# Patient Record
Sex: Male | Born: 1950 | Hispanic: Yes | Marital: Married | Smoking: Never smoker
Health system: Southern US, Community
[De-identification: ages and names within clinical notes are randomized; demographics above are authoritative.]

## PROBLEM LIST (undated history)

## (undated) DIAGNOSIS — I10 Essential (primary) hypertension: Secondary | ICD-10-CM

## (undated) DIAGNOSIS — I639 Cerebral infarction, unspecified: Secondary | ICD-10-CM

## (undated) HISTORY — PX: OTHER SURGICAL HISTORY: SHX169

---

## 2009-11-29 ENCOUNTER — Emergency Department (HOSPITAL_BASED_OUTPATIENT_CLINIC_OR_DEPARTMENT_OTHER): Admission: EM | Admit: 2009-11-29 | Discharge: 2009-11-29 | Payer: Self-pay | Admitting: Emergency Medicine

## 2010-10-11 LAB — URINALYSIS, ROUTINE W REFLEX MICROSCOPIC
Glucose, UA: NEGATIVE mg/dL
Hgb urine dipstick: NEGATIVE
Protein, ur: NEGATIVE mg/dL
Specific Gravity, Urine: 1.031 — ABNORMAL HIGH (ref 1.005–1.030)

## 2012-02-28 ENCOUNTER — Emergency Department (HOSPITAL_COMMUNITY)
Admission: EM | Admit: 2012-02-28 | Discharge: 2012-02-28 | Disposition: A | Discharge status: Home / Self Care | Payer: Medicare PPO | Attending: Emergency Medicine | Admitting: Emergency Medicine

## 2012-02-28 ENCOUNTER — Encounter (HOSPITAL_COMMUNITY): Payer: Self-pay | Admitting: *Deleted

## 2012-02-28 DIAGNOSIS — S335XXA Sprain of ligaments of lumbar spine, initial encounter: Secondary | ICD-10-CM | POA: Insufficient documentation

## 2012-02-28 DIAGNOSIS — I10 Essential (primary) hypertension: Secondary | ICD-10-CM | POA: Insufficient documentation

## 2012-02-28 DIAGNOSIS — X500XXA Overexertion from strenuous movement or load, initial encounter: Secondary | ICD-10-CM | POA: Insufficient documentation

## 2012-02-28 DIAGNOSIS — S39012A Strain of muscle, fascia and tendon of lower back, initial encounter: Secondary | ICD-10-CM

## 2012-02-28 MED ORDER — OXYCODONE-ACETAMINOPHEN 5-325 MG PO TABS
1.0000 | ORAL_TABLET | Freq: Once | ORAL | Status: DC
  Filled 2012-02-28: qty 1

## 2012-02-28 MED ORDER — DIAZEPAM 5 MG PO TABS
10.0000 mg | ORAL_TABLET | Freq: Once | ORAL | Status: AC
  Administered 2012-02-28: 10 mg via ORAL
  Filled 2012-02-28: qty 2

## 2012-02-28 MED ORDER — DIAZEPAM 5 MG PO TABS: 5.0000 mg | ORAL_TABLET | Freq: Four times a day (QID) | ORAL | Status: AC | PRN

## 2012-02-28 NOTE — ED Notes (Signed)
Pt states bent over to pick up something last night and felt a "ripping" sensation in lower right back; since then has had severe lower back pain; no leg numbness/tingling or loss of bladder control

## 2012-02-28 NOTE — ED Provider Notes (Signed)
I saw and evaluated the patient, reviewed the resident's note and I agree with the findings and plan.  Patient here with musculoskeletal back pain after bending over. No signs of neurologic compromise. Will be treated with opiates and muscle relaxants  Toy Baker, MD 02/28/12 [phone]

## 2012-02-28 NOTE — ED Provider Notes (Signed)
History    Pt is a 61 yo male w pmh of HTN presenting w complaint of R lower back pain x 1 day. Pt was in usual state of health until last night when he bent at the waist to pick up something off of the ground and noticed a "ripping" sensation followed by severe pain in his R lower back. He took one 200mg  ibuprofen but pain persisted this morning and is 10/10.  He is able to ambulate with pain and endorses pain with both sitting and standing. He denies any urinary or stool incontinence. He denies any numbness over back, RLE, or perianal/genital region. He denies any weakness of gait.  He has previously had a similar episode in the past for which he was treated here. He has never had any back surgereies.   CSN: [id number]  Arrival date & time 02/28/12  3086   First MD Initiated Contact with Patient 02/28/12 [phone]      Chief Complaint  Patient presents with  . Back Injury    (Consider location/radiation/quality/duration/timing/severity/associated sxs/prior treatment) HPI  History reviewed. No pertinent past medical history.  History reviewed. No pertinent past surgical history.  No family history on file.  History  Substance Use Topics  . Smoking status: Not on file  . Smokeless tobacco: Not on file  . Alcohol Use: Not on file      Review of Systems  Musculoskeletal: Positive for back pain. Negative for joint swelling and gait problem.  Neurological: Negative for weakness and numbness.    Allergies  Review of patient's allergies indicates no known allergies.  Home Medications   Current Outpatient Rx  Name Route Sig Dispense Refill  . ATENOLOL 25 MG PO TABS Oral Take 25 mg by mouth daily.    Marland Kitchen VITAMIN D 1000 UNITS PO TABS Oral Take 1,000 Units by mouth daily.    Carma Leaven M PLUS PO TABS Oral Take 1 tablet by mouth daily.      BP 138/69  Pulse 53  Temp 97.6 F (36.4 C) (Oral)  Resp 22  Ht 5\' 6"  (1.676 m)  Wt 170 lb (77.111 kg)  BMI 27.44 kg/m2  SpO2  100%  Physical Exam  Constitutional: He is oriented to person, place, and time. He appears well-developed and well-nourished. No distress.  HENT:  Head: Normocephalic and atraumatic.  Eyes: Conjunctivae are normal. Pupils are equal, round, and reactive to light.  Neck: Normal range of motion. Neck supple.  Cardiovascular: Normal rate, regular rhythm and intact distal pulses.  Exam reveals no gallop and no friction rub.   No murmur heard. Pulmonary/Chest: Effort normal and breath sounds normal.  Abdominal: Soft. Bowel sounds are normal. He exhibits no distension. There is no tenderness.  Musculoskeletal:       Lumbar back: He exhibits spasm.       Pt w tenderness to palpation over R lower back. Does not radiate. Full strength, ROM and normal reflexes of RLE. 2+ posterior tibial pulses.   Neurological: He is alert and oriented to person, place, and time. He has normal strength and normal reflexes.    ED Course  Procedures (including critical care time)  Labs Reviewed - No data to display No results found.   No diagnosis found.    MDM  1. Low back strain Pt w R sided muscle spasm. No red flag symptoms or PE findings to suggest nerve encroachment or canal stenosis. Discharged with Valium 5mg  q6 hrs prn muscle spasm.  Bronson Curb, MD 02/28/12 0830

## 2012-02-28 NOTE — Progress Notes (Signed)
Pt does not want the Oxycodone. He has had it before and does not like it. He wants something else. ED MD notified.

## 2012-02-28 NOTE — ED Notes (Signed)
Pt sts his lower back is hurting, pt sts that he bent over to pick an item and felt his back rip. Pt sts no back surgeries.

## 2012-02-29 NOTE — ED Provider Notes (Signed)
I saw and evaluated the patient, reviewed the resident's note and I agree with the findings and plan.  Toy Baker, MD 02/29/12 [phone]

## 2012-03-02 ENCOUNTER — Emergency Department (HOSPITAL_BASED_OUTPATIENT_CLINIC_OR_DEPARTMENT_OTHER)
Admission: EM | Admit: 2012-03-02 | Discharge: 2012-03-02 | Disposition: A | Discharge status: Home / Self Care | Payer: Medicare PPO | Attending: Emergency Medicine | Admitting: Emergency Medicine

## 2012-03-02 ENCOUNTER — Encounter (HOSPITAL_BASED_OUTPATIENT_CLINIC_OR_DEPARTMENT_OTHER): Payer: Self-pay

## 2012-03-02 DIAGNOSIS — X500XXA Overexertion from strenuous movement or load, initial encounter: Secondary | ICD-10-CM | POA: Insufficient documentation

## 2012-03-02 DIAGNOSIS — S39012A Strain of muscle, fascia and tendon of lower back, initial encounter: Secondary | ICD-10-CM

## 2012-03-02 DIAGNOSIS — I10 Essential (primary) hypertension: Secondary | ICD-10-CM | POA: Insufficient documentation

## 2012-03-02 DIAGNOSIS — S335XXA Sprain of ligaments of lumbar spine, initial encounter: Secondary | ICD-10-CM | POA: Insufficient documentation

## 2012-03-02 HISTORY — DX: Essential (primary) hypertension: I10

## 2012-03-02 MED ORDER — KETOROLAC TROMETHAMINE 60 MG/2ML IM SOLN
60.0000 mg | Freq: Once | INTRAMUSCULAR | Status: AC
  Administered 2012-03-02: 60 mg via INTRAMUSCULAR
  Filled 2012-03-02: qty 2

## 2012-03-02 MED ORDER — HYDROMORPHONE HCL PF 2 MG/ML IJ SOLN
1.0000 mg | Freq: Once | INTRAMUSCULAR | Status: AC
  Administered 2012-03-02: 2 mg via INTRAMUSCULAR
  Filled 2012-03-02: qty 1

## 2012-03-02 NOTE — ED Provider Notes (Signed)
History     CSN: [id number]  Arrival date & time 03/02/12  1344   First MD Initiated Contact with Patient 03/02/12 1405      Chief Complaint  Patient presents with  . Back Pain    (Consider location/radiation/quality/duration/timing/severity/associated sxs/prior treatment) HPI Patient is a 61 year old male who presents today for evaluation of right-sided lower back pain. He was last seen for this 2 days ago. At that time patient was given prescription for Valium for back pain but this has not helped. Patient was offered prescription for opiate but declined this as Percocet has made him feel sick in the past. Today is the patient's third presentation of her for back pain. 2 days ago he developed right-sided lower back pain after bending over. He denies any incontinence or neurologic symptoms. Patient denies any urinary symptoms as well as nausea, vomiting, or fevers. Patient has previously been treated in the past with an unknown injection in the emergency department with resolution of his symptoms. Pain is currently rated an 8/10, described as aching and sharp, and worse with ambulation or movement. There are no other associated or modifying factors. Past Medical History  Diagnosis Date  . Hypertension     History reviewed. No pertinent past surgical history.  History reviewed. No pertinent family history.  History  Substance Use Topics  . Smoking status: Never Smoker   . Smokeless tobacco: Never Used  . Alcohol Use: No      Review of Systems  Constitutional: Negative.   HENT: Negative.   Eyes: Negative.   Respiratory: Negative.   Cardiovascular: Negative.   Gastrointestinal: Negative.   Genitourinary: Negative.   Musculoskeletal: Positive for back pain.  Skin: Negative.   Neurological: Negative.   Hematological: Negative.   Psychiatric/Behavioral: Negative.   All other systems reviewed and are negative.    Allergies  Review of patient's allergies indicates no  known allergies.  Home Medications   Current Outpatient Rx  Name Route Sig Dispense Refill  . ATENOLOL 25 MG PO TABS Oral Take 25 mg by mouth daily.    Marland Kitchen VITAMIN D 1000 UNITS PO TABS Oral Take 1,000 Units by mouth daily.    Marland Kitchen DIAZEPAM 5 MG PO TABS Oral Take 1 tablet (5 mg total) by mouth every 6 (six) hours as needed (muscle spasm). 30 tablet 0  . THERA M PLUS PO TABS Oral Take 1 tablet by mouth daily.      BP 132/77  Pulse 54  Temp 98.2 F (36.8 C) (Oral)  Resp 16  Ht 5\' 6"  (1.676 m)  Wt 170 lb (77.111 kg)  BMI 27.44 kg/m2  SpO2 97%  Physical Exam  Nursing note and vitals reviewed. GEN: Well-developed, well-nourished male in no distress HEENT: Atraumatic, normocephalic.  EYES: PERRLA BL, no scleral icterus. NECK: Trachea midline, no meningismus CV: regular rate and rhythm. No murmurs, rubs, or gallops PULM: No respiratory distress.  No crackles, wheezes, or rales. GI: soft, non-tender. No guarding, rebound, or tenderness. + bowel sounds  GU: deferred Neuro: cranial nerves grossly 2-12 intact, no abnormalities of strength or sensation, A and O x 3 MSK: Patient moves all 4 extremities symmetrically, no deformity, edema, or injury noted. Patient does have tenderness to palpation in the right paraspinal musculature in the lumbar region. No midline tenderness to palpation or step-off. Skin: No rashes petechiae, purpura, or jaundice Psych: no abnormality of mood   ED Course  Procedures (including critical care time)  Labs Reviewed - No data to  display No results found.   1. Low back strain       MDM  Patient was evaluated by myself. The plan had been for patient to be discharged with opiate medication as well as Valium and previous visit patient had declined opiate medication. He had no improvement with Valium. His presentation have not otherwise changed. He had no symptoms of urinary pathology. Patient also had no neurologic symptoms or incontinence. Patient was given a  dose of Toradol as family reported nausea with Percocet. Patient had only slight improvement in his pain with this. Patient was then given a dose of Dilaudid 1 mg IM and with this had significant resolution. Patient was discharged. Patient and family again declined prescription for any other medication. Patient is discharged in good condition and can followup with his primary care Dr. as needed.        Cyndra Numbers, MD 03/02/12 1600

## 2012-03-02 NOTE — ED Notes (Signed)
Back pain since Tuesday.  He was seen in ED, received Valium without relief.

## 2015-07-09 ENCOUNTER — Encounter (HOSPITAL_BASED_OUTPATIENT_CLINIC_OR_DEPARTMENT_OTHER): Payer: Self-pay | Admitting: *Deleted

## 2015-07-09 ENCOUNTER — Emergency Department (HOSPITAL_BASED_OUTPATIENT_CLINIC_OR_DEPARTMENT_OTHER)
Admission: EM | Admit: 2015-07-09 | Discharge: 2015-07-09 | Disposition: A | Discharge status: Home / Self Care | Payer: Medicare HMO | Attending: Emergency Medicine | Admitting: Emergency Medicine

## 2015-07-09 DIAGNOSIS — I10 Essential (primary) hypertension: Secondary | ICD-10-CM | POA: Insufficient documentation

## 2015-07-09 DIAGNOSIS — R42 Dizziness and giddiness: Secondary | ICD-10-CM

## 2015-07-09 DIAGNOSIS — Z8673 Personal history of transient ischemic attack (TIA), and cerebral infarction without residual deficits: Secondary | ICD-10-CM | POA: Insufficient documentation

## 2015-07-09 DIAGNOSIS — R110 Nausea: Secondary | ICD-10-CM | POA: Insufficient documentation

## 2015-07-09 DIAGNOSIS — E876 Hypokalemia: Secondary | ICD-10-CM | POA: Diagnosis not present

## 2015-07-09 DIAGNOSIS — Z79899 Other long term (current) drug therapy: Secondary | ICD-10-CM | POA: Insufficient documentation

## 2015-07-09 LAB — CBC WITH DIFFERENTIAL/PLATELET
BASOS ABS: 0 10*3/uL (ref 0.0–0.1)
BASOS PCT: 0 %
Eosinophils Absolute: 0.1 10*3/uL (ref 0.0–0.7)
Eosinophils Relative: 1 %
HEMATOCRIT: 42.4 % (ref 39.0–52.0)
HEMOGLOBIN: 14.6 g/dL (ref 13.0–17.0)
Lymphocytes Relative: 49 %
Lymphs Abs: 4.5 10*3/uL — ABNORMAL HIGH (ref 0.7–4.0)
MCH: 31.5 pg (ref 26.0–34.0)
MCHC: 34.4 g/dL (ref 30.0–36.0)
MCV: 91.6 fL (ref 78.0–100.0)
MONO ABS: 0.7 10*3/uL (ref 0.1–1.0)
Monocytes Relative: 7 %
NEUTROS ABS: 4 10*3/uL (ref 1.7–7.7)
NEUTROS PCT: 43 %
Platelets: 231 10*3/uL (ref 150–400)
RBC: 4.63 MIL/uL (ref 4.22–5.81)
RDW: 11.9 % (ref 11.5–15.5)
WBC: 9.2 10*3/uL (ref 4.0–10.5)

## 2015-07-09 LAB — BASIC METABOLIC PANEL
ANION GAP: 10 (ref 5–15)
BUN: 14 mg/dL (ref 6–20)
CO2: 24 mmol/L (ref 22–32)
Calcium: 8.9 mg/dL (ref 8.9–10.3)
Chloride: 106 mmol/L (ref 101–111)
Creatinine, Ser: 0.92 mg/dL (ref 0.61–1.24)
Glucose, Bld: 109 mg/dL — ABNORMAL HIGH (ref 65–99)
Potassium: 3.1 mmol/L — ABNORMAL LOW (ref 3.5–5.1)
Sodium: 140 mmol/L (ref 135–145)

## 2015-07-09 LAB — URINALYSIS, ROUTINE W REFLEX MICROSCOPIC
Bilirubin Urine: NEGATIVE
GLUCOSE, UA: NEGATIVE mg/dL
HGB URINE DIPSTICK: NEGATIVE
Ketones, ur: NEGATIVE mg/dL
Leukocytes, UA: NEGATIVE
Nitrite: NEGATIVE
PH: 6 (ref 5.0–8.0)
PROTEIN: NEGATIVE mg/dL
Specific Gravity, Urine: 1.003 — ABNORMAL LOW (ref 1.005–1.030)

## 2015-07-09 LAB — TROPONIN I: Troponin I: 0.03 ng/mL (ref <0.031)

## 2015-07-09 MED ORDER — POTASSIUM CHLORIDE CRYS ER 20 MEQ PO TBCR
40.0000 meq | EXTENDED_RELEASE_TABLET | Freq: Once | ORAL | Status: AC
  Administered 2015-07-09: 40 meq via ORAL
  Filled 2015-07-09: qty 2

## 2015-07-09 MED ORDER — MECLIZINE HCL 25 MG PO TABS
25.0000 mg | ORAL_TABLET | Freq: Once | ORAL | Status: AC
  Administered 2015-07-09: 25 mg via ORAL
  Filled 2015-07-09: qty 1

## 2015-07-09 MED ORDER — ONDANSETRON HCL 4 MG/2ML IJ SOLN
4.0000 mg | Freq: Once | INTRAMUSCULAR | Status: AC
  Administered 2015-07-09: 4 mg via INTRAVENOUS
  Filled 2015-07-09: qty 2

## 2015-07-09 MED ORDER — ONDANSETRON 4 MG PO TBDP: 4.0000 mg | ORAL_TABLET | Freq: Three times a day (TID) | ORAL | Status: AC | PRN

## 2015-07-09 MED ORDER — SODIUM CHLORIDE 0.9 % IV BOLUS (SEPSIS)
1000.0000 mL | Freq: Once | INTRAVENOUS | Status: AC
  Administered 2015-07-09: 1000 mL via INTRAVENOUS

## 2015-07-09 MED ORDER — MECLIZINE HCL 25 MG PO TABS: 25.0000 mg | ORAL_TABLET | Freq: Three times a day (TID) | ORAL | Status: AC | PRN

## 2015-07-09 NOTE — ED Provider Notes (Signed)
CSN: [id number]     Arrival date & time 07/09/15  1520 History   First MD Initiated Contact with Patient 07/09/15 1558     Chief Complaint  Patient presents with  . Dizziness     (Consider location/radiation/quality/duration/timing/severity/associated sxs/prior Treatment) Patient is a 64 y.o. male presenting with dizziness. The history is provided by the patient and medical records.  Dizziness Associated symptoms: nausea     64 year old male with history of hypertension, presenting to the ED for dizziness present this morning upon waking. Patient states he felt very sleepy when he woke up this morning, once he stood up he got very dizzy which he described as a "room spinning sensation". He states his dizziness seems worse when he changes position or moves. He reports some nausea without vomiting. Symptoms are better when lying still with his eyes closed in the dark. He denies any headache, confusion, numbness, weakness, changes in speech, fever, chills, or neck pain. No chest pain or shortness of breath.  No abdominal pain or diarrhea.  He did drink water and eat a piece of bread this morning.  No recent changes in medication. Daughter reports they believe he had a stroke approximately 5 years ago, he was seen in a different facility for this and they do not have old records. At that time he presented with significant confusion and garbled speech. She reports he is on daily aspirin for stroke prevention. He has not had any other issues with stroke-like symptoms or TIA's since this time.  Past Medical History  Diagnosis Date  . Hypertension    History reviewed. No pertinent past surgical history. History reviewed. No pertinent family history. Social History  Substance Use Topics  . Smoking status: Never Smoker   . Smokeless tobacco: Never Used  . Alcohol Use: No    Review of Systems  Gastrointestinal: Positive for nausea.  Neurological: Positive for dizziness.  All other systems  reviewed and are negative.     Allergies  Review of patient's allergies indicates no known allergies.  Home Medications   Prior to Admission medications   Medication Sig Start Date End Date Taking? Authorizing Provider  atenolol (TENORMIN) 25 MG tablet Take 25 mg by mouth daily.    Historical Provider, MD  cholecalciferol (VITAMIN D) 1000 UNITS tablet Take 1,000 Units by mouth daily.    Historical Provider, MD  Multiple Vitamins-Minerals (MULTIVITAMINS THER. W/MINERALS) TABS Take 1 tablet by mouth daily.    Historical Provider, MD   BP 149/71 mmHg  Pulse 57  Temp(Src) 97.4 F (36.3 C) (Oral)  Resp 18  Ht 5\' 5"  (1.651 m)  Wt 81.647 kg  BMI 29.95 kg/m2  SpO2 100%   Physical Exam  Constitutional: He is oriented to person, place, and time. He appears well-developed and well-nourished. No distress.  Lying in dark room, eyes closed  HENT:  Head: Normocephalic and atraumatic.  Mouth/Throat: Oropharynx is clear and moist.  Eyes: Conjunctivae and EOM are normal. Pupils are equal, round, and reactive to light.  Neck: Normal range of motion. Neck supple.  Cardiovascular: Normal rate, regular rhythm and normal heart sounds.   Pulmonary/Chest: Effort normal and breath sounds normal. No respiratory distress. He has no wheezes.  Abdominal: Soft. Bowel sounds are normal. There is no tenderness. There is no guarding.  Musculoskeletal: Normal range of motion.  Neurological: He is alert and oriented to person, place, and time.  AAOx3, answering questions appropriately; equal strength UE and LE bilaterally; CN grossly intact; moves all  extremities appropriately without ataxia; normal coordination, no pronator drift; no focal neuro deficits or facial asymmetry appreciated  Skin: Skin is warm and dry. He is not diaphoretic.  Psychiatric: He has a normal mood and affect.  Nursing note and vitals reviewed.   ED Course  Procedures (including critical care time) Labs Review Labs Reviewed  CBC  WITH DIFFERENTIAL/PLATELET - Abnormal; Notable for the following:    Lymphs Abs 4.5 (*)    All other components within normal limits  BASIC METABOLIC PANEL - Abnormal; Notable for the following:    Potassium 3.1 (*)    Glucose, Bld 109 (*)    All other components within normal limits  URINALYSIS, ROUTINE W REFLEX MICROSCOPIC (NOT AT [facility]) - Abnormal; Notable for the following:    Specific Gravity, Urine 1.003 (*)    All other components within normal limits  TROPONIN I    Imaging Review No results found. I have personally reviewed and evaluated these images and lab results as part of my medical decision-making.   EKG Interpretation   Date/Time:  Friday July 09 2015 15:32:07 EST Ventricular Rate:  60 PR Interval:  194 QRS Duration: 138 QT Interval:  484 QTC Calculation: 484 R Axis:   -3 Text Interpretation:  Normal sinus rhythm Right bundle branch block  Abnormal ECG No old tracing to compare Confirmed by [facility]  MD, MARTHA  [phone]) on 07/09/2015 4:21:04 PM      MDM   Final diagnoses:  Dizziness  Nausea  Hypokalemia   64 year old male here with room spinning dizziness upon waking this morning. Symptoms worse with movement with associated nausea. He does have history of stroke approximately 5 years ago however symptoms were vastly different at that time.  Patient is afebrile, nontoxic. His neurologic exam is nonfocal.  His symptoms seem vertiginous in nature.  Will obtain labs, EKG. Patient be treated with IV fluids, Zofran, and meclizine. Will reassess.  6:34 PM Workup is largely reassuring aside from a mild hypokalemia at 3.1. This was replaced in the ED. After treatment with IV fluids, meclizine, and Zofran, patient states he is feeling much better. He reports his dizziness has resolved. I personally assisted patient out of bed, he was able to get up and ambulate independently to the restroom, no ataxia noted. VS remain stable.  Suspect that his symptoms are due to  peripheral vertigo. Will discharge home with meclizine and Zofran. Patient to follow-up with his PCP next week. Discussed plan with patient, he/she acknowledged understanding and agreed with plan of care.  Return precautions given for new or worsening symptoms.  Garlon Hatchet, PA-C 07/09/15 1959  Jerelyn Scott, MD 07/09/15 2007

## 2015-07-09 NOTE — ED Notes (Signed)
Pt to room 8 in w/c, reports feeling dizzy x 1 hour, "like the room is spinning around, especially when I stand up..." pt denies ha, n/v or any other c/o, states he "felt tired" when he woke up this morning. Denies cp. Sob, states "it gets better when I close my eyes.Marland Kitchen.Marland Kitchen."

## 2015-07-09 NOTE — Discharge Instructions (Signed)
Take the prescribed medication as directed-- these are the medications given to you here that helped with your symptoms. Follow-up with your primary care physician next week. Return to the ED for new or worsening symptoms.  Vrtigo posicional benigno (Benign Positional Vertigo) El vrtigo es la sensacin de que usted o todo lo que lo rodea se mueven cuando en realidad eso no sucede. El vrtigo posicional benigno es el tipo de vrtigo ms comn. La causa de este trastorno no es grave (es benigna). Algunos movimientos y determinadas posiciones pueden desencadenar el trastorno (es posicional). El vrtigo posicional benigno puede ser peligroso si ocurre mientras est haciendo algo que podra suponer un riesgo para usted y para los dems, por ejemplo, conduciendo un automvil.  CAUSAS En muchos de los Unitycasos, se desconoce la causa de este trastorno. Puede deberse a Hotel manageruna alteracin en una zona del odo interno que ayuda al cerebro a percibir el movimiento y a Administrator, Civil Servicecoordinar el equilibrio. Esta alteracin puede deberse a una infeccin viral (laberintitis), a una lesin en la cabeza o a los movimientos reiterados. FACTORES DE RIESGO Es ms probable que esta afeccin se manifieste en:  Las mujeres.  Las Smith Internationalpersonas mayores de [id number]. SNTOMAS Generalmente, los sntomas de este trastorno se presentan al mover la cabeza o los ojos en diferentes direcciones. Pueden aparecer repentinamente y suelen durar menos de un minuto. Entre los sntomas se pueden incluir los siguientes:  Prdida del equilibrio y cadas.  Sensacin de estar dando vueltas o movindose.  Sensacin de que el entorno est dando vueltas o movindose.  Nuseas y vmitos.  Visin borrosa.  Mareos.  Movimientos oculares involuntarios (nistagmo). Los sntomas pueden ser leves y algo fastidiosos, o pueden ser graves e interferir en la vida cotidiana. Los episodios de vrtigo posicional benigno pueden repetirse (ser recurrentes) a lo largo del  [place]ub[place]tiempo, y algunos movimientos pueden desencadenarlos. Los sntomas pueden mejorar con Museum/gallery conservatorel tiempo. DIAGNSTICO Generalmente, este trastorno se diagnostica con una historia clnica y un examen fsico de la cabeza, el cuello y los odos. Tal vez lo deriven a Catering managerun mdico especialista en problemas de la garganta, la nariz y el odo (otorrinolaringlogo), o a uno que se especializa en trastornos del sistema nervioso (neurlogo). Pueden hacerle otros estudios, entre ellos:  Health visitoresonancia magntica.  Tomografa computarizada.  Estudios de los Ecolabmovimientos oculares. El mdico puede pedirle que cambie rpidamente de posicin mientras observa si se presentan sntomas de vrtigo posicional benigno, por ejemplo, nistagmo. Los movimientos oculares se pueden estudiar con una electronistagmografa (ENG), con estimulacin trmica, mediante la maniobra de [place] o con la prueba de rotacin.  Electroencefalograma (EEG). Este estudio registra la actividad elctrica del cerebro.  Pruebas de audicin. Lissa MoralesRATAMIENTO Generalmente, para tratar este trastorno, el mdico le har movimientos especficos con la cabeza para que el odo interno se normalice. Mohawk IndustriesCuando los casos son graves, tal vez haya que realizar una ciruga, pero esto no es frecuente. En algunos casos, el vrtigo posicional benigno se resuelve por s solo en el trmino de 2 o 4semanas. INSTRUCCIONES PARA EL CUIDADO EN EL HOGAR Seguridad  Muvase lentamente.No haga movimientos bruscos con el cuerpo o con la cabeza.  No conduzca.  No opere maquinaria pesada.  No haga ninguna tarea que podra ser peligrosa para usted o para Economistotras personas en caso de que ocurriera un episodio de vrtigo.  Si tiene dificultad para caminar o mantener el equilibrio, use un bastn para Photographermantener la estabilidad. Si se siente mareado o inestable, sintese de inmediato.  Reanude sus actividades  normales como se lo haya indicado el mdico. Pregntele al mdico qu actividades son  seguras para usted. Instrucciones generales  [employer] de venta libre y los recetados solamente como se lo haya indicado el mdico.  Evite algunas posiciones o determinados movimientos como se lo haya indicado el mdico.  Beba suficiente lquido para Pharmacologist la orina clara o de color amarillo plido.  Concurra a todas las visitas de control como se lo haya indicado el mdico. Esto es importante. SOLICITE ATENCIN MDICA SI:  Lance Muss.  El trastorno [place], o le aparecen sntomas nuevos.  Sus familiares o amigos advierten cambios en su comportamiento.  Las nuseas o los vmitos empeoran.  Tiene sensacin de hormigueo o de adormecimiento. SOLICITE ATENCIN MDICA DE INMEDIATO SI:  Tiene dificultad para hablar o para moverse.  Esta mareado todo [employer].  Se desmaya.  Tiene dolores de cabeza intensos.  Tiene debilidad en los brazos o las piernas.  Tiene cambios en la audicin o la visin.  Siente rigidez en el cuello.  Tiene sensibilidad a Statistician.   Esta informacin no tiene Theme park manager el consejo del mdico. Asegrese de hacerle al mdico cualquier pregunta que tenga.   Document Released: 10/26/2008 Document Revised: 03/31/2015 Elsevier Interactive Patient Education [employer]

## 2017-01-20 ENCOUNTER — Encounter (HOSPITAL_BASED_OUTPATIENT_CLINIC_OR_DEPARTMENT_OTHER): Payer: Self-pay | Admitting: Emergency Medicine

## 2017-01-20 ENCOUNTER — Emergency Department (HOSPITAL_BASED_OUTPATIENT_CLINIC_OR_DEPARTMENT_OTHER): Payer: Medicare HMO

## 2017-01-20 ENCOUNTER — Emergency Department (HOSPITAL_BASED_OUTPATIENT_CLINIC_OR_DEPARTMENT_OTHER)
Admission: EM | Admit: 2017-01-20 | Discharge: 2017-01-20 | Disposition: A | Discharge status: Home / Self Care | Payer: Medicare HMO | Attending: Emergency Medicine | Admitting: Emergency Medicine

## 2017-01-20 DIAGNOSIS — Z79899 Other long term (current) drug therapy: Secondary | ICD-10-CM | POA: Diagnosis not present

## 2017-01-20 DIAGNOSIS — R1013 Epigastric pain: Secondary | ICD-10-CM | POA: Insufficient documentation

## 2017-01-20 DIAGNOSIS — I10 Essential (primary) hypertension: Secondary | ICD-10-CM | POA: Insufficient documentation

## 2017-01-20 DIAGNOSIS — Z8673 Personal history of transient ischemic attack (TIA), and cerebral infarction without residual deficits: Secondary | ICD-10-CM | POA: Diagnosis not present

## 2017-01-20 HISTORY — DX: Cerebral infarction, unspecified: I63.9

## 2017-01-20 LAB — COMPREHENSIVE METABOLIC PANEL
ALT: 13 U/L — ABNORMAL LOW (ref 17–63)
AST: 19 U/L (ref 15–41)
Albumin: 4.3 g/dL (ref 3.5–5.0)
Alkaline Phosphatase: 70 U/L (ref 38–126)
Anion gap: 8 (ref 5–15)
BUN: 14 mg/dL (ref 6–20)
CHLORIDE: 100 mmol/L — AB (ref 101–111)
CO2: 30 mmol/L (ref 22–32)
Calcium: 9.7 mg/dL (ref 8.9–10.3)
Creatinine, Ser: 0.94 mg/dL (ref 0.61–1.24)
GFR calc Af Amer: >60 mL/min (ref >60)
Glucose, Bld: 97 mg/dL (ref 65–99)
POTASSIUM: 4.2 mmol/L (ref 3.5–5.1)
Sodium: 138 mmol/L (ref 135–145)
TOTAL PROTEIN: 7.5 g/dL (ref 6.5–8.1)
Total Bilirubin: 0.9 mg/dL (ref 0.3–1.2)

## 2017-01-20 LAB — CBC
HEMATOCRIT: 45.6 % (ref 39.0–52.0)
Hemoglobin: 16.1 g/dL (ref 13.0–17.0)
MCH: 32.3 pg (ref 26.0–34.0)
MCHC: 35.3 g/dL (ref 30.0–36.0)
MCV: 91.4 fL (ref 78.0–100.0)
PLATELETS: 227 10*3/uL (ref 150–400)
RBC: 4.99 MIL/uL (ref 4.22–5.81)
RDW: 12 % (ref 11.5–15.5)
WBC: 8.5 10*3/uL (ref 4.0–10.5)

## 2017-01-20 LAB — TROPONIN I

## 2017-01-20 LAB — LIPASE, BLOOD: LIPASE: 34 U/L (ref 11–51)

## 2017-01-20 MED ORDER — PANTOPRAZOLE SODIUM 40 MG PO TBEC
40.0000 mg | DELAYED_RELEASE_TABLET | Freq: Once | ORAL | Status: AC
  Administered 2017-01-20: 40 mg via ORAL
  Filled 2017-01-20: qty 1

## 2017-01-20 MED ORDER — GI COCKTAIL ~~LOC~~
30.0000 mL | Freq: Once | ORAL | Status: AC
  Administered 2017-01-20: 30 mL via ORAL
  Filled 2017-01-20: qty 30

## 2017-01-20 MED ORDER — PANTOPRAZOLE SODIUM 20 MG PO TBEC: 20.0000 mg | DELAYED_RELEASE_TABLET | Freq: Every day | ORAL | 0 refills | Status: AC

## 2017-01-20 NOTE — ED Notes (Signed)
Pt states the medication did not help. Epigastric pain initially began 2 weeks ago after eating pizza and has not gotten any better. Has never happened before. Family member at bedside.

## 2017-01-20 NOTE — ED Triage Notes (Signed)
Pt reports intermittent sharp abdominal pains in epigastrium for past 2 weeks.  Pt states first day he felt the pain he vomited 4x, but has not vomited since.  Pt reports normal BM's.  Pain comes in waves lasting approx 5 minutes at a time, worse last night and today.

## 2017-01-20 NOTE — ED Provider Notes (Signed)
[facility] Provider Note   CSN: [id number] Arrival date & time: 01/20/17  1251     History   Chief Complaint Chief Complaint  Patient presents with  . Abdominal Pain    HPI Phillip Lin is a 66 y.o. male.  HPI  66 y.o. male with a hx of HTN, presents to the Emergency Department today due to epigastric pain x 2 weeks. Notes pain intermittent and sharp sensation. Rates pain currently 8/10. Notes N/V x 4 yesterday. None today. No fevers. No cough/congestion. No CP/SOB. Denies radiation or diaphoresis. No worsening with exercise or exertion. Notes worsening with PO intake. Has had normal BMs. No hx ACS. No FH. No other symptoms noted.   Past Medical History:  Diagnosis Date  . Hypertension   . Stroke [facility](HCC)     There are no active problems to display for this patient.   Past Surgical History:  Procedure Laterality Date  . lumph node Left        Home Medications    Prior to Admission medications   Medication Sig Start Date End Date Taking? Authorizing Provider  atenolol (TENORMIN) 25 MG tablet Take 50 mg by mouth daily.     [provider]  cholecalciferol (VITAMIN D) 1000 UNITS tablet Take 1,000 Units by mouth daily.    [provider]  meclizine (ANTIVERT) 25 MG tablet Take 1 tablet (25 mg total) by mouth 3 (three) times daily as needed. 07/09/15   Garlon HatchetSanders, Lisa M, PA-C  Multiple Vitamins-Minerals (MULTIVITAMINS THER. W/MINERALS) TABS Take 1 tablet by mouth daily.    [provider]  ondansetron (ZOFRAN ODT) 4 MG disintegrating tablet Take 1 tablet (4 mg total) by mouth every 8 (eight) hours as needed for nausea. 07/09/15   Garlon HatchetSanders, Lisa M, PA-C    Family History No family history on file.  Social History Social History  Substance Use Topics  . Smoking status: Never Smoker  . Smokeless tobacco: Never Used  . Alcohol use No     Allergies   Patient has no known allergies.   Review of Systems Review of  Systems ROS reviewed and all are negative for acute change except as noted in the HPI.  Physical Exam Updated Vital Signs BP 134/72 (BP Location: Right Arm)   Pulse (!) 54   Temp 98.3 F (36.8 C) (Oral)   Resp 20   Ht 5' 5.5" (1.664 m)   Wt 81.6 kg (180 lb)   SpO2 98%   BMI 29.50 kg/m   Physical Exam  Constitutional: He is oriented to person, place, and time. Vital signs are normal. He appears well-developed and well-nourished.  HENT:  Head: Normocephalic and atraumatic.  Right Ear: Hearing normal.  Left Ear: Hearing normal.  Eyes: Conjunctivae and EOM are normal. Pupils are equal, round, and reactive to light.  Neck: Normal range of motion. Neck supple.  Cardiovascular: Normal rate, regular rhythm, normal heart sounds and intact distal pulses.   Pulmonary/Chest: Effort normal and breath sounds normal.  Abdominal: Soft. There is tenderness in the epigastric area. There is no rigidity, no rebound, no guarding, no CVA tenderness, no tenderness at McBurney's point and negative Murphy's sign.  Musculoskeletal: Normal range of motion.  Neurological: He is alert and oriented to person, place, and time.  Skin: Skin is warm and dry.  Psychiatric: He has a normal mood and affect. His speech is normal and behavior is normal. Thought content normal.  Nursing note and vitals reviewed.   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 EXAM: CHEST  2 VIEW COMPARISON:  None. FINDINGS: Cardiomediastinal silhouette is normal. Mediastinal contours appear intact. There is no evidence of focal airspace consolidation, pleural effusion or pneumothorax. Osseous structures are without acute abnormality. 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 Soft tissues are grossly normal. IMPRESSION: No active cardiopulmonary disease. Electronically Signed   By: Ted Mcalpine M.D.   On: 01/20/2017 14:22    Procedures Procedures (including critical care time)  Medications Ordered in ED Medications  gi cocktail (Maalox,Lidocaine,Donnatal) (30 mLs Oral Given 01/20/17 1427)     Initial Impression / Assessment and Plan / ED Course  I have reviewed the triage vital signs and the nursing notes.  Pertinent labs & imaging results that were available during my care of the patient were reviewed by me and considered in my medical decision making (see chart for details).  Final Clinical Impressions(s) / ED Diagnoses  {I have reviewed and evaluated the relevant laboratory values. {I have reviewed and evaluated the relevant imaging studies. {I have interpreted the relevant EKG. {I have reviewed the relevant previous healthcare records.  {I obtained HPI from historian.   ED Course:  Assessment: Patient is a 66 y.o. male presents with abdominal pain x 2 week.s Epigastric. N/V yesterday. None today. No fevers. No CP/SOB. On exam, nontoxic, nonseptic appearing, in no apparent distress. Patient's pain and other symptoms adequately managed in emergency department.  Fluid bolus given.  Labs, imaging and vitals reviewed.  CBC unremarkable. CMP unremarkable. Lipase negative. Trop negative. EKG unremarkable. CXR unremarkable. GI coktail  with moderate relief. Patient does not meet the SIRS or Sepsis criteria.  On repeat exam patient does not have a surgical abdomen and there are no peritoneal signs.  No indication of appendicitis, bowel obstruction, bowel perforation, cholecystitis, diverticulitis. Likely reflux symptom. Given Rx PPI and follow up to PCP. Patient discharged home with symptomatic treatment and given strict instructions for follow-up with their primary care physician.  I have also discussed reasons to return immediately to the ER.  Patient expresses understanding and agrees with plan.  Disposition/Plan:  DC Home Additional Verbal discharge instructions given and discussed with patient.  Pt Instructed to f/u with PCP in the next week for evaluation and treatment of symptoms. Return precautions given Pt acknowledges and agrees with plan  Supervising Physician Pricilla Loveless, MD  Final diagnoses:  Epigastric pain    New Prescriptions New Prescriptions   No medications on file     Audry Pili, Cordelia Poche 01/20/17 1527    Pricilla Loveless, MD 01/26/17 [phone]

## 2017-01-20 NOTE — Discharge Instructions (Signed)
Please read and follow all provided instructions.  Your diagnoses today include:  1. Epigastric pain    Tests performed today include: Blood counts and electrolytes Blood tests to check liver and kidney function Blood tests to check pancreas function Urine test to look for infection and pregnancy (in women) Vital signs. See below for your results today.   Medications prescribed:   Take any prescribed medications only as directed.  Home care instructions:  Follow any educational materials contained in this packet.  Follow-up instructions: Please follow-up with your primary care provider in the next 2 days for further evaluation of your symptoms.    Return instructions:  SEEK IMMEDIATE MEDICAL ATTENTION IF: The pain does not go away or becomes severe  A temperature above 101F develops  Repeated vomiting occurs (multiple episodes)  The pain becomes localized to portions of the abdomen. The right side could possibly be appendicitis. In an adult, the left lower portion of the abdomen could be colitis or diverticulitis.  Blood is being passed in stools or vomit (bright red or black tarry stools)  You develop chest pain, difficulty breathing, dizziness or fainting, or become confused, poorly responsive, or inconsolable (young children) If you have any other emergent concerns regarding your health  Additional Information: Abdominal (belly) pain can be caused by many things. Your caregiver performed an examination and possibly ordered blood/urine tests and imaging (CT scan, x-rays, ultrasound). Many cases can be observed and treated at home after initial evaluation in the emergency department. Even though you are being discharged home, abdominal pain can be unpredictable. Therefore, you need a repeated exam if your pain does not resolve, returns, or worsens. Most patients with abdominal pain don't have to be admitted to the hospital or have surgery, but serious problems like appendicitis and  gallbladder attacks can start out as nonspecific pain. Many abdominal conditions cannot be diagnosed in one visit, so follow-up evaluations are very important.  Your vital signs today were: BP (!) 156/78 (BP Location: Left Arm)    Pulse (!) 54    Temp 97.8 F (36.6 C) (Oral)    Resp 18    Ht 5' 5.5" (1.664 m)    Wt 81.6 kg (180 lb)    SpO2 100%    BMI 29.50 kg/m  If your blood pressure (bp) was elevated above 135/85 this visit, please have this repeated by your doctor within one month. --------------

## 2018-05-18 ENCOUNTER — Emergency Department (HOSPITAL_BASED_OUTPATIENT_CLINIC_OR_DEPARTMENT_OTHER)
Admission: EM | Admit: 2018-05-18 | Discharge: 2018-05-18 | Disposition: A | Discharge status: Home / Self Care | Payer: Medicare HMO | Attending: Emergency Medicine | Admitting: Emergency Medicine

## 2018-05-18 ENCOUNTER — Other Ambulatory Visit: Payer: Self-pay

## 2018-05-18 ENCOUNTER — Encounter (HOSPITAL_BASED_OUTPATIENT_CLINIC_OR_DEPARTMENT_OTHER): Payer: Self-pay | Admitting: Emergency Medicine

## 2018-05-18 DIAGNOSIS — M545 Low back pain, unspecified: Secondary | ICD-10-CM

## 2018-05-18 DIAGNOSIS — I10 Essential (primary) hypertension: Secondary | ICD-10-CM | POA: Insufficient documentation

## 2018-05-18 DIAGNOSIS — Z79899 Other long term (current) drug therapy: Secondary | ICD-10-CM | POA: Diagnosis not present

## 2018-05-18 MED ORDER — KETOROLAC TROMETHAMINE 30 MG/ML IJ SOLN
15.0000 mg | Freq: Once | INTRAMUSCULAR | Status: AC
  Administered 2018-05-18: 15 mg via INTRAMUSCULAR
  Filled 2018-05-18: qty 1

## 2018-05-18 MED ORDER — DEXAMETHASONE SODIUM PHOSPHATE 10 MG/ML IJ SOLN
10.0000 mg | Freq: Once | INTRAMUSCULAR | Status: AC
  Administered 2018-05-18: 10 mg via INTRAMUSCULAR
  Filled 2018-05-18: qty 1

## 2018-05-18 NOTE — ED Triage Notes (Signed)
Patient states that he feels like he pulled his back - he was giving his dog a bath and he started to have the pain

## 2018-05-18 NOTE — ED Provider Notes (Signed)
[facility] EMERGENCY DEPARTMENT Provider Note   CSN: [id number] Arrival date & time: 05/18/18  2012     History   Chief Complaint Chief Complaint  Patient presents with  . Back Pain    HPI Phillip Lin is a 67 y.o. male with PMHx HTN, chronic intermittent R-sided low back pain, cervical radiculopathy, presenting to the ED with complaint of acute onset of right sided low back pain.  Patient was reportedly bathing the dog and when he went to stand up he felt a pull in his right lower back.  He has had issues with this in the past and states this feels very similar.  He has been evaluated multiple times for this, and states he received an injection that provided relief.  He is unsure of the name of the injection.  He states pain is worse with movement and ambulation.  Denies radiation down his leg, abdominal pain, bowel or bladder incontinence, numbness or weakness of extremities.  No medications tried prior to arrival.  The history is provided by the patient and a relative.    Past Medical History:  Diagnosis Date  . Hypertension   . Stroke [facility])     There are no active problems to display for this patient.   Past Surgical History:  Procedure Laterality Date  . lumph node Left         Home Medications    Prior to Admission medications   Medication Sig Start Date End Date Taking? Authorizing Provider  atenolol (TENORMIN) 25 MG tablet Take 50 mg by mouth daily.     [provider]  cholecalciferol (VITAMIN D) 1000 UNITS tablet Take 1,000 Units by mouth daily.    [provider]  meclizine (ANTIVERT) 25 MG tablet Take 1 tablet (25 mg total) by mouth 3 (three) times daily as needed. 07/09/15   Garlon Hatchet, PA-C  Multiple Vitamins-Minerals (MULTIVITAMINS THER. W/MINERALS) TABS Take 1 tablet by mouth daily.    [provider]  ondansetron (ZOFRAN ODT) 4 MG disintegrating tablet Take 1 tablet (4 mg total) by mouth every 8 (eight)  hours as needed for nausea. 07/09/15   Garlon Hatchet, PA-C  pantoprazole (PROTONIX) 20 MG tablet Take 1 tablet (20 mg total) by mouth daily. 01/20/17   Audry Pili, PA-C    Family History History reviewed. No pertinent family history.  Social History Social History   Tobacco Use  . Smoking status: Never Smoker  . Smokeless tobacco: Never Used  Substance Use Topics  . Alcohol use: No  . Drug use: No     Allergies   Patient has no known allergies.   Review of Systems Review of Systems  Constitutional: Negative for fever.  Gastrointestinal: Negative for abdominal pain.  Genitourinary: Negative for difficulty urinating.  Musculoskeletal: Positive for back pain.  Neurological: Negative for weakness and numbness.     Physical Exam Updated Vital Signs BP (!) 157/68 (BP Location: Left Arm)   Pulse (!) 57   Temp 98.6 F (37 C) (Oral)   Resp 16   Ht 5\' 5"  (1.651 m)   Wt 83.9 kg   SpO2 100%   BMI 30.79 kg/m   Physical Exam  Constitutional: He appears well-developed and well-nourished. No distress.  HENT:  Head: Normocephalic and atraumatic.  Eyes: Conjunctivae are normal.  Cardiovascular: Regular rhythm and normal heart sounds.  Slightly bradycardic  Pulmonary/Chest: Effort normal and breath sounds normal. No respiratory distress.  Abdominal: Soft. Bowel sounds are normal.  There is no tenderness.  Musculoskeletal:       Back:  No midline spinal tenderness.  Right paraspinal tenderness in the lumbar spine.  Neurological:  Motor:  Normal tone. 5/5 in lower extremities bilaterally including strong and equal dorsiflexion/plantar flexion Sensory: Pinprick and light touch normal in BLE extremities.  Deep Tendon Reflexes: 2+ and symmetric in the b/l patella Gait: normal gait and balance CV: distal pulses palpable throughout    Psychiatric: He has a normal mood and affect. His behavior is normal.  Nursing note and vitals reviewed.    ED Treatments / Results    Labs (all labs ordered are listed, but only abnormal results are displayed) Labs Reviewed - No data to display  EKG None  Radiology No results found.  Procedures Procedures (including critical care time)  Medications Ordered in ED Medications  dexamethasone (DECADRON) injection 10 mg (10 mg Intramuscular Given 05/18/18 2257)  ketorolac (TORADOL) 30 MG/ML injection 15 mg (15 mg Intramuscular Given 05/18/18 2259)     Initial Impression / Assessment and Plan / ED Course  I have reviewed the triage vital signs and the nursing notes.  Pertinent labs & imaging results that were available during my care of the patient were reviewed by me and considered in my medical decision making (see chart for details).     Patient with acute exacerbation of chronic right sided low back pain.  No neurological deficits and normal neuro exam.  Patient can walk but states is painful.  No loss of bowel or bladder control.  No concern for cauda equina.  No fever, night sweats, weight loss, h/o cancer, IVDU.  RICE protocol and pain medicine indicated and discussed with patient.   Discussed results, findings, treatment and follow up. Patient advised of return precautions. Patient verbalized understanding and agreed with plan.  Final Clinical Impressions(s) / ED Diagnoses   Final diagnoses:  Right-sided low back pain without sciatica, unspecified chronicity    ED Discharge Orders    None       Avaline Stillson, [place] N, PA-C 05/18/18 2303    Loren Racer, MD 05/19/18 1744

## 2018-05-18 NOTE — Discharge Instructions (Signed)
Please read instructions below.  You can take 600 mg of ibuprofen every 6 hours as needed for pain.   Apply ice to your back for 20 minutes at a time.  You can also apply heat if this provides more relief.   Follow-up with your primary care provider symptoms persist.   Return to ER if new numbness or tingling in your arms or legs, inability to urinate, inability to hold your bowels, or weakness in your extremities.

## 2018-10-11 IMAGING — CR DG CHEST 2V
2 series · 2 of 2 positions shown · non-contrast
Comparison: None.

CLINICAL DATA: Intermittent sharp epigastric pain.

EXAM:
CHEST  2 VIEW

[w chest pa]
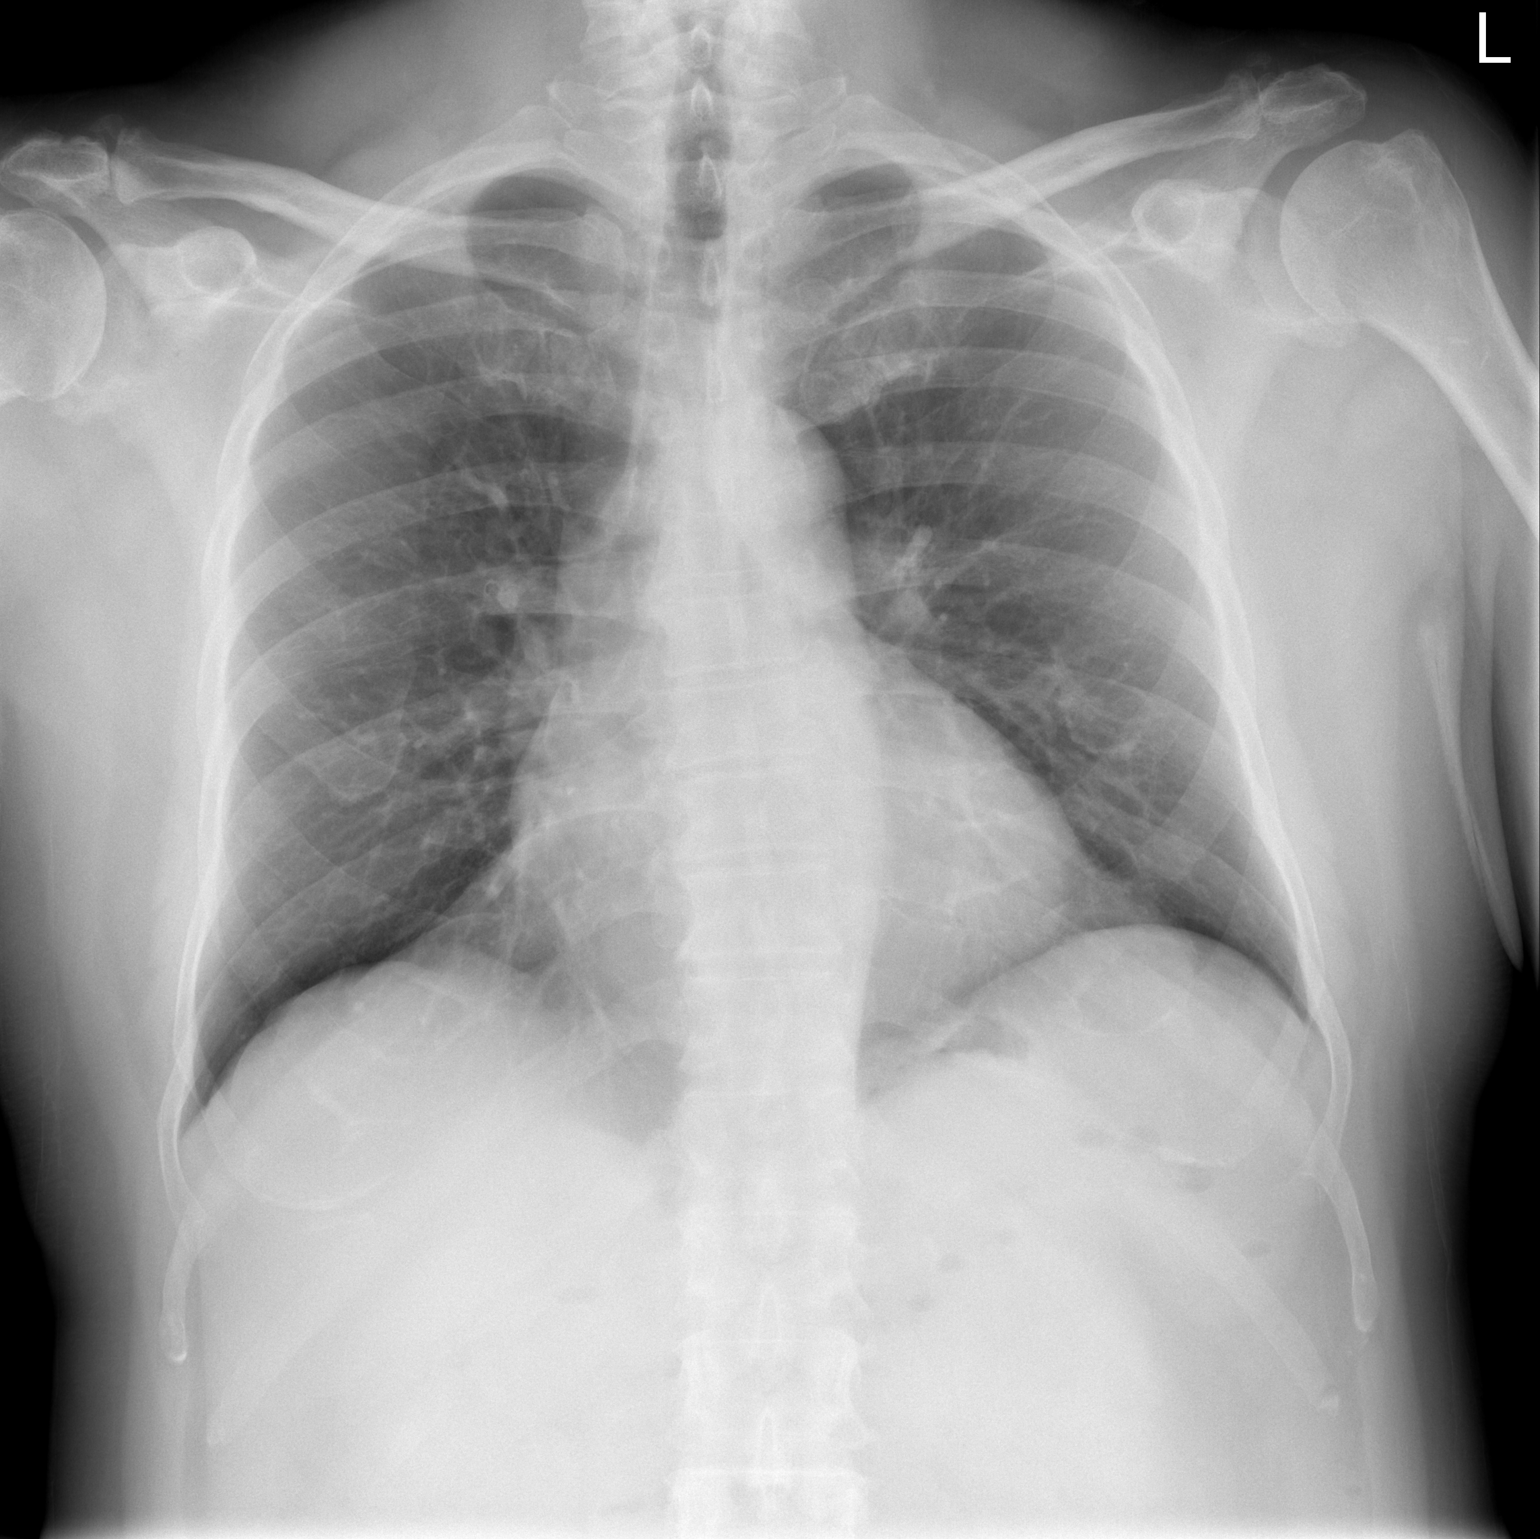

[w chest lat]
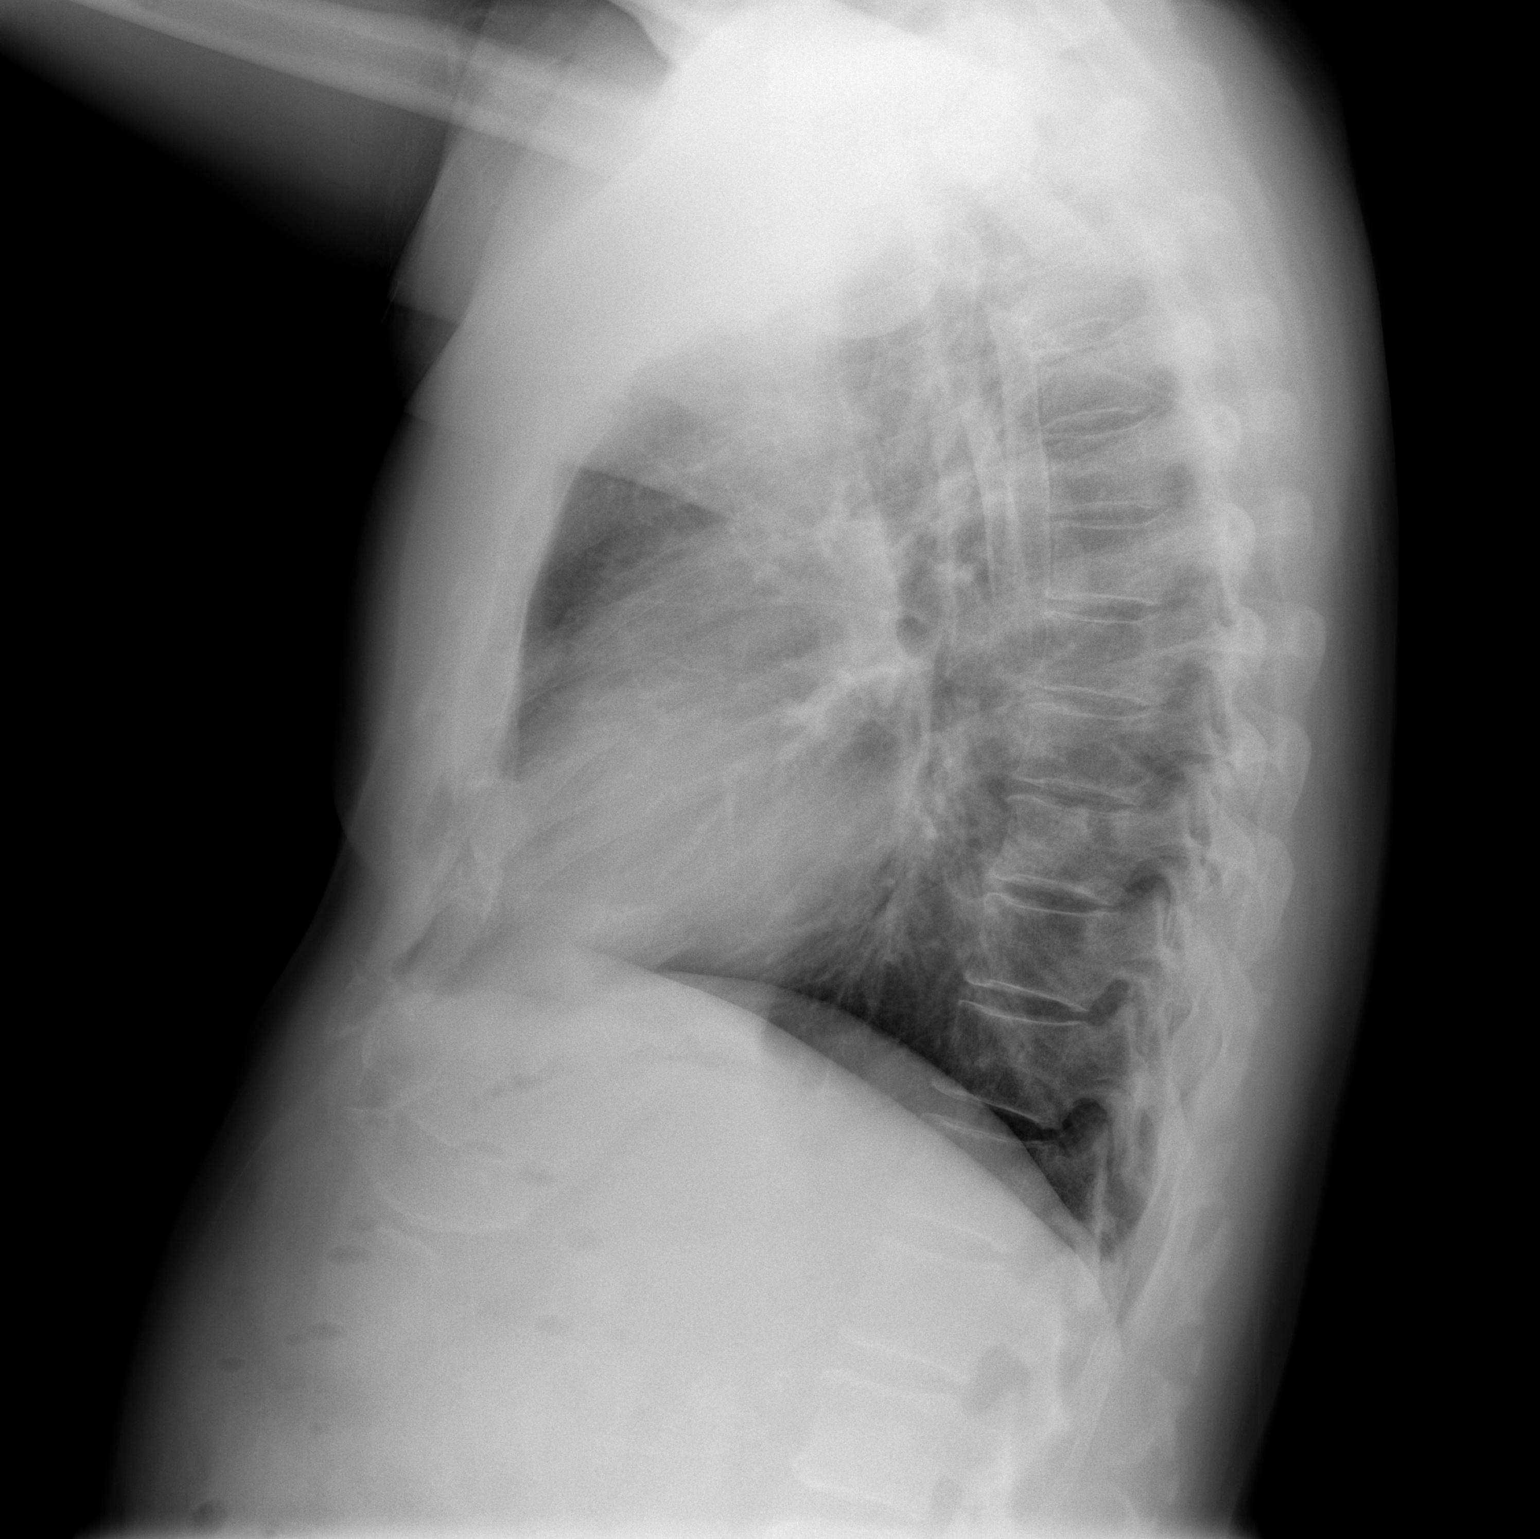

[2 of 2 positions shown; findings below may reference images not displayed]

FINDINGS: Cardiomediastinal silhouette is normal. Mediastinal contours appear
intact.

There is no evidence of focal airspace consolidation, pleural
effusion or pneumothorax.

Osseous structures are without acute abnormality. Chronic
osteoarthritic changes of bilateral glenohumeral joints. Soft
tissues are grossly normal.
IMPRESSION: No active cardiopulmonary disease.

## 2023-10-29 ENCOUNTER — Other Ambulatory Visit: Payer: Self-pay | Admitting: Cardiovascular Disease

## 2023-10-29 ENCOUNTER — Ambulatory Visit
Admission: RE | Admit: 2023-10-29 | Discharge: 2023-10-29 | Disposition: A | Discharge status: Home / Self Care | Source: Ambulatory Visit | Attending: Cardiovascular Disease | Admitting: Cardiovascular Disease

## 2023-10-29 DIAGNOSIS — M79643 Pain in unspecified hand: Secondary | ICD-10-CM
